# Patient Record
Sex: Male | Born: 2005 | Race: Black or African American | Hispanic: No | Marital: Single | Smoking: Never smoker
Health system: Southern US, Community
[De-identification: ages and names within clinical notes are randomized; demographics above are authoritative.]

## PROBLEM LIST (undated history)

## (undated) DIAGNOSIS — F909 Attention-deficit hyperactivity disorder, unspecified type: Secondary | ICD-10-CM

## (undated) DIAGNOSIS — J302 Other seasonal allergic rhinitis: Secondary | ICD-10-CM

## (undated) DIAGNOSIS — K311 Adult hypertrophic pyloric stenosis: Secondary | ICD-10-CM

---
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
## 2024-03-17 DIAGNOSIS — R062 Wheezing: Secondary | ICD-10-CM | POA: Diagnosis present

## 2024-03-17 DIAGNOSIS — J9801 Acute bronchospasm: Secondary | ICD-10-CM | POA: Insufficient documentation

## 2024-03-17 DIAGNOSIS — T7840XA Allergy, unspecified, initial encounter: Secondary | ICD-10-CM | POA: Diagnosis not present

## 2024-03-18 ENCOUNTER — Emergency Department

## 2024-03-18 ENCOUNTER — Emergency Department
Admission: EM | Admit: 2024-03-18 | Discharge: 2024-03-18 | Disposition: A | Discharge status: Home / Self Care | Attending: Emergency Medicine | Admitting: Emergency Medicine

## 2024-03-18 DIAGNOSIS — T7840XA Allergy, unspecified, initial encounter: Secondary | ICD-10-CM

## 2024-03-18 DIAGNOSIS — J9801 Acute bronchospasm: Secondary | ICD-10-CM

## 2024-03-18 LAB — CBC
HCT: 51.5 % (ref 39.0–52.0)
Hemoglobin: 16.5 g/dL (ref 13.0–17.0)
MCH: 29.1 pg (ref 26.0–34.0)
MCHC: 32 g/dL (ref 30.0–36.0)
MCV: 90.8 fL (ref 80.0–100.0)
Platelets: 241 K/uL (ref 150–400)
RBC: 5.67 MIL/uL (ref 4.22–5.81)
RDW: 12.2 % (ref 11.5–15.5)
WBC: 10.5 K/uL (ref 4.0–10.5)
nRBC: 0 % (ref 0.0–0.2)

## 2024-03-18 LAB — BASIC METABOLIC PANEL WITH GFR
Anion gap: 12 (ref 5–15)
BUN: 10 mg/dL (ref 6–20)
CO2: 24 mmol/L (ref 22–32)
Calcium: 9.1 mg/dL (ref 8.9–10.3)
Chloride: 105 mmol/L (ref 98–111)
Creatinine, Ser: 0.89 mg/dL (ref 0.61–1.24)
GFR, Estimated: >60 mL/min (ref >60)
Glucose, Bld: 100 mg/dL — ABNORMAL HIGH (ref 70–99)
Potassium: 3.5 mmol/L (ref 3.5–5.1)
Sodium: 141 mmol/L (ref 135–145)

## 2024-03-18 LAB — RESP PANEL BY RT-PCR (RSV, FLU A&B, COVID)  RVPGX2
Influenza A by PCR: NEGATIVE
Influenza B by PCR: NEGATIVE
Resp Syncytial Virus by PCR: NEGATIVE
SARS Coronavirus 2 by RT PCR: NEGATIVE

## 2024-03-18 LAB — TROPONIN I (HIGH SENSITIVITY): Troponin I (High Sensitivity): 5 ng/L (ref <18)

## 2024-03-18 MED ORDER — IPRATROPIUM-ALBUTEROL 0.5-2.5 (3) MG/3ML IN SOLN
3.0000 mL | Freq: Once | RESPIRATORY_TRACT | Status: AC
  Administered 2024-03-18: 3 mL via RESPIRATORY_TRACT
  Filled 2024-03-18: qty 3

## 2024-03-18 MED ORDER — ALBUTEROL SULFATE HFA 108 (90 BASE) MCG/ACT IN AERS: 2.0000 | INHALATION_SPRAY | RESPIRATORY_TRACT | 0 refills | Status: AC | PRN

## 2024-03-18 MED ORDER — PREDNISONE 20 MG PO TABS
60.0000 mg | ORAL_TABLET | Freq: Once | ORAL | Status: AC
  Administered 2024-03-18: 60 mg via ORAL
  Filled 2024-03-18: qty 3

## 2024-03-18 MED ORDER — ACETAMINOPHEN 500 MG PO TABS
1000.0000 mg | ORAL_TABLET | Freq: Once | ORAL | Status: AC
  Administered 2024-03-18: 1000 mg via ORAL
  Filled 2024-03-18: qty 2

## 2024-03-18 MED ORDER — FLUTICASONE PROPIONATE 50 MCG/ACT NA SUSP: 1.0000 | Freq: Every day | NASAL | 2 refills | Status: AC

## 2024-03-18 MED ORDER — CETIRIZINE HCL 10 MG PO TABS: 10.0000 mg | ORAL_TABLET | Freq: Every day | ORAL | 2 refills | Status: AC

## 2024-03-18 MED ORDER — PREDNISONE 20 MG PO TABS: 60.0000 mg | ORAL_TABLET | Freq: Every day | ORAL | 0 refills | Status: AC

## 2024-03-18 MED ORDER — ALBUTEROL SULFATE HFA 108 (90 BASE) MCG/ACT IN AERS
2.0000 | INHALATION_SPRAY | Freq: Once | RESPIRATORY_TRACT | Status: AC
  Administered 2024-03-18: 2 via RESPIRATORY_TRACT
  Filled 2024-03-18: qty 6.7

## 2024-03-18 NOTE — ED Triage Notes (Signed)
 Pt states that he has been SOB and CP today while working. Pt very congested

## 2024-03-18 NOTE — Discharge Instructions (Addendum)
 You may alternate over the counter Tylenol  1000 mg every 6 hours as needed for pain, fever and Ibuprofen 800 mg every 6-8 hours as needed for pain, fever.  Please take Ibuprofen with food.  Do not take more than 4000 mg of Tylenol  (acetaminophen ) in a 24 hour period.   You may use your albuterol  inhaler 2 to 4 puffs every 2-4 hours as needed for shortness of breath, wheezing, chest tightness.

## 2024-03-18 NOTE — ED Provider Notes (Signed)
 [facility] Provider Note    Event Date/Time   First MD Initiated Contact with Patient 03/18/24 [phone]     (approximate)   History   Chest Pain   HPI  Mario Ramsey is a 18 y.o. male with history of seasonal allergies who presents to the emergency department with chest tightness, shortness of breath, wheezing.  Symptoms ongoing intermittently for the past week.  States for started when he was putting out hay.  He has been taking Zyrtec  with some relief.  Has never been diagnosed with asthma.  No fevers, productive cough.  No lower extremity swelling or discomfort.  Symptoms have improved.   History provided by patient, family.    Past Medical History:  Diagnosis Date   ADHD    Pyloric stenosis    Seasonal allergies     History reviewed. No pertinent surgical history.  MEDICATIONS:  Prior to Admission medications   Medication Sig Start Date End Date Taking? Authorizing Provider  amoxicillin  (AMOXIL ) 875 MG tablet Take 1 tablet (875 mg total) by mouth 2 (two) times daily. 06/20/18   Cuthriell, Dorn BIRCH, PA-C  cetirizine  (ZYRTEC ) 10 MG tablet Take 1 tablet (10 mg total) by mouth daily. 06/20/18   Cuthriell, Jonathan D, PA-C  fluticasone  (FLONASE ) 50 MCG/ACT nasal spray Place 1 spray into both nostrils 2 (two) times daily. 06/20/18   Cuthriell, Dorn BIRCH, PA-C    Physical Exam   Triage Vital Signs: ED Triage Vitals  Encounter Vitals Group     BP 03/17/24 2359 133/81     Girls Systolic BP Percentile --      Girls Diastolic BP Percentile --      Boys Systolic BP Percentile --      Boys Diastolic BP Percentile --      Pulse Rate 03/17/24 2359 70     Resp 03/17/24 2359 18     Temp 03/17/24 2359 97.8 F (36.6 C)     Temp Source 03/17/24 2359 Oral     SpO2 03/17/24 2359 96 %     Weight 03/17/24 2359 153 lb (69.4 kg)     Height 03/17/24 2359 6' (1.829 m)     Head Circumference --      Peak Flow --      Pain Score 03/18/24 0004 5     Pain  Loc --      Pain Education --      Exclude from Growth Chart --     Most recent vital signs: Vitals:   03/18/24 0455 03/18/24 0634  BP:  124/74  Pulse:  67  Resp:  20  Temp:  98.7 F (37.1 C)  SpO2: 100% 100%    CONSTITUTIONAL: Alert, responds appropriately to questions. Well-appearing; well-nourished HEAD: Normocephalic, atraumatic EYES: Conjunctivae clear, pupils appear equal, sclera nonicteric ENT: normal nose; moist mucous membranes NECK: Supple, normal ROM CARD: RRR; S1 and S2 appreciated RESP: Normal chest excursion without splinting or tachypnea; scattered expiratory wheezes, no rhonchi, no rales, no hypoxia or respiratory distress, speaking full sentences ABD/GI: Non-distended; soft, non-tender, no rebound, no guarding, no peritoneal signs BACK: The back appears normal EXT: Normal ROM in all joints; no deformity noted, no edema, no calf tenderness or calf swelling SKIN: Normal color for age and race; warm; no rash on exposed skin NEURO: Moves all extremities equally, normal speech PSYCH: The patient's mood and manner are appropriate.   ED Results / Procedures / Treatments   LABS: (all labs ordered are  listed, but only abnormal results are displayed) Labs Reviewed  BASIC METABOLIC PANEL WITH GFR - Abnormal; Notable for the following components:      Result Value   Glucose, Bld 100 (*)    All other components within normal limits  RESP PANEL BY RT-PCR (RSV, FLU A&B, COVID)  [id number]  CBC  TROPONIN I (HIGH SENSITIVITY)     EKG:  EKG Interpretation Date/Time:  Friday March 17 2024 23:59:01 EDT Ventricular Rate:  73 PR Interval:  170 QRS Duration:  96 QT Interval:  386 QTC Calculation: 425 R Axis:   96  Text Interpretation: Normal sinus rhythm Rightward axis Borderline ECG No previous ECGs available Confirmed by Neomi Neptune [phone]) on 03/18/2024 4:35:56 AM         RADIOLOGY: My personal review and interpretation of imaging: Chest x-ray clear  I  have personally reviewed all radiology reports.   DG Chest 2 View Result Date: 03/18/2024 EXAM: 2 VIEW(S) XRAY OF THE CHEST 03/18/2024 12:24:06 AM COMPARISON: 06/20/2018 CLINICAL HISTORY: CP. Pt states that he has been SOB and CP today while working. Pt very congested. FINDINGS: LUNGS AND PLEURA: No focal pulmonary opacity. No pulmonary edema. No pleural effusion. No pneumothorax. HEART AND MEDIASTINUM: No acute abnormality of the cardiac and mediastinal silhouettes. BONES AND SOFT TISSUES: No acute osseous abnormality. IMPRESSION: 1. No acute abnormalities. Electronically signed by: Pinkie Pebbles MD 03/18/2024 12:27 AM EDT RP Workstation: [id number]     PROCEDURES:  Critical Care performed: No   CRITICAL CARE Performed by: Neptune Abdirahim Flavell   Total critical care time: 0 minutes  Critical care time was exclusive of separately billable procedures and treating other patients.  Critical care was necessary to treat or prevent imminent or life-threatening deterioration.  Critical care was time spent personally by me on the following activities: development of treatment plan with patient and/or surrogate as well as nursing, discussions with consultants, evaluation of patient's response to treatment, examination of patient, obtaining history from patient or surrogate, ordering and performing treatments and interventions, ordering and review of laboratory studies, ordering and review of radiographic studies, pulse oximetry and re-evaluation of patient's condition.   Procedures    IMPRESSION / MDM / ASSESSMENT AND PLAN / ED COURSE  I reviewed the triage vital signs and the nursing notes.    Patient here for chest tightness, shortness of breath, wheezing     DIFFERENTIAL DIAGNOSIS (includes but not limited to):   Bronchospasm secondary to allergies, asthma, viral URI, pneumonia, doubt PE, CHF, ACS, pneumothorax   Patient's presentation is most consistent with acute presentation with  potential threat to life or bodily function.   PLAN: Will give breathing treatment, prednisone .  EKG nonischemic.  Chest x-ray reviewed and interpreted by myself and the radiologist and is unremarkable.  No leukocytosis.  Negative troponin.  Normal electrolytes.   MEDICATIONS GIVEN IN ED: Medications  ipratropium-albuterol  (DUONEB) 0.5-2.5 (3) MG/3ML nebulizer solution 3 mL (3 mLs Nebulization Given 03/18/24 0540)  albuterol  (VENTOLIN  HFA) 108 (90 Base) MCG/ACT inhaler 2 puff (2 puffs Inhalation Given 03/18/24 0608)  predniSONE  (DELTASONE ) tablet 60 mg (60 mg Oral Given 03/18/24 0539)  acetaminophen  (TYLENOL ) tablet 1,000 mg (1,000 mg Oral Given 03/18/24 0540)     ED COURSE: Patient's lungs now clear after DuoNeb.  Will discharge with albuterol  inhaler, prednisone  burst.  He has a PCP for follow-up.  Mother is concerned that he could have underlying asthma.  Will give pulmonology follow-up information in case symptoms continue as he may need  pulmonary function test.  Recommended Flonase , Zyrtec  daily.   At this time, I do not feel there is any life-threatening condition present. I reviewed all nursing notes, vitals, pertinent previous records.  All lab and urine results, EKGs, imaging ordered have been independently reviewed and interpreted by myself.  I reviewed all available radiology reports from any imaging ordered this visit.  Based on my assessment, I feel the patient is safe to be discharged home without further emergent workup and can continue workup as an outpatient as needed. Discussed all findings, treatment plan as well as usual and customary return precautions.  They verbalize understanding and are comfortable with this plan.  Outpatient follow-up has been provided as needed.  All questions have been answered.    CONSULTS:  none   OUTSIDE RECORDS REVIEWED: Reviewed prior family medicine notes.       FINAL CLINICAL IMPRESSION(S) / ED DIAGNOSES   Final diagnoses:   Bronchospasm  Allergy, initial encounter     Rx / DC Orders   ED Discharge Orders          Ordered    predniSONE  (DELTASONE ) 20 MG tablet  Daily        03/18/24 0610    albuterol  (VENTOLIN  HFA) 108 (90 Base) MCG/ACT inhaler  Every 4 hours PRN        03/18/24 0610    cetirizine  (ZYRTEC  ALLERGY) 10 MG tablet  Daily        03/18/24 0610    fluticasone  (FLONASE ) 50 MCG/ACT nasal spray  Daily        03/18/24 0610             Note:  This document was prepared using Dragon voice recognition software and may include unintentional dictation errors.   Juron Vorhees, Josette SAILOR, DO 03/18/24 [phone]
